# Patient Record
Sex: Male | Born: 1995 | Race: White | Hispanic: No | Marital: Single | Smoking: Former smoker
Health system: Southern US, Community
[De-identification: ages and names within clinical notes are randomized; demographics above are authoritative.]

## PROBLEM LIST (undated history)

## (undated) DIAGNOSIS — J45909 Unspecified asthma, uncomplicated: Secondary | ICD-10-CM

## (undated) HISTORY — PX: OTHER SURGICAL HISTORY: SHX169

## (undated) HISTORY — PX: WISDOM TOOTH EXTRACTION: SHX21

---
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
## 2015-01-04 ENCOUNTER — Encounter: Payer: Self-pay | Admitting: Emergency Medicine

## 2015-01-04 ENCOUNTER — Emergency Department: Payer: 59

## 2015-01-04 ENCOUNTER — Emergency Department
Admission: EM | Admit: 2015-01-04 | Discharge: 2015-01-04 | Disposition: A | Discharge status: Home / Self Care | Payer: 59 | Attending: Emergency Medicine | Admitting: Emergency Medicine

## 2015-01-04 DIAGNOSIS — S62326A Displaced fracture of shaft of fifth metacarpal bone, right hand, initial encounter for closed fracture: Secondary | ICD-10-CM | POA: Insufficient documentation

## 2015-01-04 DIAGNOSIS — S62339A Displaced fracture of neck of unspecified metacarpal bone, initial encounter for closed fracture: Secondary | ICD-10-CM

## 2015-01-04 DIAGNOSIS — Y9289 Other specified places as the place of occurrence of the external cause: Secondary | ICD-10-CM | POA: Insufficient documentation

## 2015-01-04 DIAGNOSIS — Y990 Civilian activity done for income or pay: Secondary | ICD-10-CM | POA: Insufficient documentation

## 2015-01-04 DIAGNOSIS — Y9389 Activity, other specified: Secondary | ICD-10-CM | POA: Insufficient documentation

## 2015-01-04 DIAGNOSIS — W228XXA Striking against or struck by other objects, initial encounter: Secondary | ICD-10-CM | POA: Insufficient documentation

## 2015-01-04 DIAGNOSIS — Z720 Tobacco use: Secondary | ICD-10-CM | POA: Insufficient documentation

## 2015-01-04 HISTORY — DX: Unspecified asthma, uncomplicated: J45.909

## 2015-01-04 MED ORDER — OXYCODONE-ACETAMINOPHEN 5-325 MG PO TABS: 1.0000 | ORAL_TABLET | Freq: Three times a day (TID) | ORAL | Status: DC | PRN

## 2015-01-04 MED ORDER — IBUPROFEN 800 MG PO TABS
ORAL_TABLET | ORAL | Status: AC
Start: 2015-01-04 — End: 2015-01-04
  Administered 2015-01-04: 1 mg
  Filled 2015-01-04: qty 1

## 2015-01-04 MED ORDER — IBUPROFEN 800 MG PO TABS: 800.0000 mg | ORAL_TABLET | Freq: Three times a day (TID) | ORAL | Status: DC | PRN

## 2015-01-04 MED ORDER — OXYCODONE-ACETAMINOPHEN 5-325 MG PO TABS
ORAL_TABLET | ORAL | Status: AC
  Administered 2015-01-04: 1
  Filled 2015-01-04: qty 1

## 2015-01-04 NOTE — ED Notes (Signed)
Patient refused sling stating that he has one at home.

## 2015-01-04 NOTE — ED Provider Notes (Signed)
[facility] Emergency Department Provider Note    ____________________________________________  Time seen: ----------------------------------------- 2150 PM on 01/04/2015 -----------------------------------------  I have reviewed the triage vital signs and the nursing notes.   HISTORY  Chief Complaint Hand Pain   HPI Clinton Wallace is a 19 y.o. male presents to the ER with complaints of right hand pain. States 3 days ago at home chopping ants with side of his right hand on ground. States described as hitting side of hand on ground in a "chopping manner". Denies fall or other injury. States since incident has continued with pain to right lateral hand.  States presents tonight due to swelling and pain persisting. Pain as sharp and aching 7/10. Denies pain radiation. Denies numbness or tingling sensation.  Patient and mother report that approximately 3 years ago with fracture to same area. Reports [place]opedic in [place].     Past Medical History  Diagnosis Date  . 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   Past Surgical History  Procedure Laterality Date  . Wisdom tooth extraction    . 5th digit both hands      No current outpatient prescriptions on file.  Allergies Review of patient's allergies indicates no known allergies.  No family history on file.  Social History History  Substance Use Topics  . Smoking status: Current Every Day Smoker -- 0.50 packs/day  . Smokeless tobacco: Not on file  . Alcohol Use: No    Review of Systems  Constitutional: Negative for fever. Eyes: Negative for visual changes. ENT: Negative for sore throat.  Cardiovascular: Negative for chest pain. Respiratory: Negative for shortness of breath. Gastrointestinal: Negative for abdominal pain, vomiting and diarrhea. Genitourinary: Negative for  dysuria. Musculoskeletal: Negative for back pain. Skin: Negative for rash. Neurological: Negative for headaches, focal weakness or numbness.  10-point ROS otherwise negative.  ____________________________________________   PHYSICAL EXAM:  VITAL SIGNS: ED Triage Vitals  Enc Vitals Group     BP 01/04/15 2005 137/83 mmHg     Pulse Rate 01/04/15 2005 99     Resp -- 18     Temp 01/04/15 2005 97.5 F (36.4 C)     Temp Source 01/04/15 2005 Oral     SpO2 01/04/15 2005 99 %     Weight 01/04/15 2005 160 lb (72.576 kg)     Height 01/04/15 2005 6' (1.829 m)     Head Cir --      Peak Flow --      Pain Score 01/04/15 2011 9     Pain Loc --      Pain Edu? --      Excl. in GC? --      Constitutional: Alert and oriented. Well appearing and in no distress. Eyes: Conjunctivae are normal.  ENT   Head: Normocephalic and atraumatic.   Nose: No congestion/rhinnorhea.   Mouth/Throat: Mucous membranes are moist. Cardiovascular: Normal rate, regular rhythm. Normal and symmetric distal pulses are present in all extremities. No murmurs, rubs, or gallops. Respiratory: Normal respiratory effort without tachypnea nor retractions. Breath sounds are clear and equal bilaterally. No wheezes/rales/rhonchi. Gastrointestinal: Soft and nontender.  Musculoskeletal: Nontender with normal range of motion in all extremities. No joint effusions.  No lower extremity tenderness nor edema. Except:  Right lateral hand along 5th metacarpal with mild to mod swelling and minimal ecchymosis. Skin intact. Moderate TTP. Distal sensation and movement to  right fifth finger intact.   No other tenderness to right hand on palpation.  Neurologic:  Normal speech and language. No gross focal neurologic deficits are appreciated. Speech is normal. No gait instability. Skin:  Skin is warm, dry and intact. No rash noted. Psychiatric: Mood and affect are normal. Speech and behavior are normal. Patient exhibits appropriate  insight and judgment.    RADIOLOGY   RIGHT HAND - COMPLETE 3+ VIEW  COMPARISON: None.  FINDINGS: There is a fracture of the midshaft fifth metacarpal. On lateral projection there is a a sharp cortical break suggesting a acute fracture.  IMPRESSION: Fracture of the midshaft fifth metacarpal.   Electronically Signed By: Genevive BiStewart Edmunds M.D. On: 01/04/2015 20:43 ____________________________________________   PROCEDURES  SPLINT APPLICATION Date/Time: 10:21 PM Authorized by: Renford DillsLindsey Maelyn Berrey Consent: Verbal consent obtained. Risks and benefits: risks, benefits and alternatives were discussed Consent given by: patient Splint applied by: ED technician Location details: right hand Splint type: ocl boxer's splint Supplies used: ocl Post-procedure: The splinted body part was neurovascularly unchanged following the procedure. Patient tolerance: Patient tolerated the procedure well with no immediate complications. sling also applied by tech. _______________________________________   INITIAL IMPRESSION / ASSESSMENT AND PLAN / ED COURSE  Pertinent labs & imaging results that were available during my care of the patient were reviewed by me and considered in my medical decision making (see chart for details).  Presents with 3 days hx of lateral right hand pain post injury at home. Denies fall or other injury. Midshaft fifth metacarpal fracture, closed. Splint. Ice and elevate. PRN pain medication with ibuprofen and percocet. Follow up with orthopedic next week. Per mother, pt has an appt with Ortho in [place] on Monday. Xray copy obtained from xray and sent home with pt for ortho appt. Discussed return parameters.   ____________________________________________   FINAL CLINICAL IMPRESSION(S) / ED DIAGNOSES  Final diagnoses:  Boxer's fracture, closed, initial encounter  Right  Renford DillsLindsey Denitra Donaghey, NP 01/04/15 2222

## 2015-01-04 NOTE — Discharge Instructions (Signed)
Keep hand in splint. Apply ice and elevate. Rest. Take medication as prescribed.   Follow up with your orthopedic as scheduled. Or refer to above to follow up next week.   Return to ER for new or worsening concerns.   Boxer's Fracture You have a break (fracture) of the fifth metacarpal bone. This is commonly called a boxer's fracture. This is the bone in the hand where the little finger attaches. The fracture is in the end of that bone, closest to the little finger. It is usually caused when you hit an object with a clenched fist. Often, the knuckle is pushed down by the impact. Sometimes, the fracture rotates out of position. A boxer's fracture will usually heal within 6 weeks, if it is treated properly and protected from re-injury. Surgery is sometimes needed. A cast, splint, or bulky hand dressing may be used to protect and immobilize a boxer's fracture. Do not remove this device or dressing until your caregiver approves. Keep your hand elevated, and apply ice packs for 15-20 minutes every 2 hours, for the first 2 days. Elevation and ice help reduce swelling and relieve pain. See your caregiver, or an orthopedic specialist, for follow-up care within the next 10 days. This is to make sure your fracture is healing properly. Document Released: 08/18/2005 Document Revised: 11/10/2011 Document Reviewed: 02/05/2007 [facility] Patient Information 2015 [employer] [place]. This information is not intended to replace advice given to you by your health care provider. Make sure you discuss any questions you have with your health care provider.

## 2015-01-04 NOTE — ED Notes (Signed)
Pt presents to ED with right hand pain; hx of boxer fx approx 3 years ago and was told by Ortho that he may need surgery. Intermittent pain since onset of injury. Pt states he was "chopping ants" while at work yesterday and it seemed to trigger pain and swelling. No other known injury. Ibuprofen and ice at home with no relief.

## 2015-01-04 NOTE — ED Notes (Signed)
Pt. States hitting ants with rt. Hand 4 days ago.  Pt. States he applied ice to area, but swelling did not decrease.  Pt. Has swelling to rt. Hand.  Pt. Also states limited movement to with 5th finger.

## 2019-03-22 ENCOUNTER — Emergency Department
Admission: EM | Admit: 2019-03-22 | Discharge: 2019-03-22 | Discharge status: Against Medical Advice | Payer: Self-pay | Attending: Emergency Medicine | Admitting: Emergency Medicine

## 2019-03-22 ENCOUNTER — Encounter: Payer: Self-pay | Admitting: Emergency Medicine

## 2019-03-22 ENCOUNTER — Other Ambulatory Visit: Payer: Self-pay

## 2019-03-22 DIAGNOSIS — Z5321 Procedure and treatment not carried out due to patient leaving prior to being seen by health care provider: Secondary | ICD-10-CM | POA: Insufficient documentation

## 2019-03-22 NOTE — ED Notes (Signed)
No answer when called several times from lobby 

## 2019-03-22 NOTE — ED Triage Notes (Signed)
Pt states that he injured his back years ago and tonight was playing softball and felt something pop and now the pain to his lower back is worse. Pt mom states that he has been to ortho MD's and chiropractors for the continued pain but nothing works.

## 2020-11-29 ENCOUNTER — Ambulatory Visit: Payer: No Typology Code available for payment source | Admitting: Family Medicine

## 2020-11-29 ENCOUNTER — Encounter: Payer: Self-pay | Admitting: Family Medicine

## 2020-11-29 ENCOUNTER — Other Ambulatory Visit: Payer: Self-pay

## 2020-11-29 ENCOUNTER — Other Ambulatory Visit: Payer: Self-pay | Admitting: Family Medicine

## 2020-11-29 VITALS — BP 125/64 | HR 62 | Temp 96.9°F | Ht 72.0 in | Wt 182.0 lb

## 2020-11-29 DIAGNOSIS — J3089 Other allergic rhinitis: Secondary | ICD-10-CM

## 2020-11-29 DIAGNOSIS — Z1159 Encounter for screening for other viral diseases: Secondary | ICD-10-CM

## 2020-11-29 DIAGNOSIS — J4520 Mild intermittent asthma, uncomplicated: Secondary | ICD-10-CM | POA: Diagnosis not present

## 2020-11-29 DIAGNOSIS — Z114 Encounter for screening for human immunodeficiency virus [HIV]: Secondary | ICD-10-CM

## 2020-11-29 DIAGNOSIS — J0110 Acute frontal sinusitis, unspecified: Secondary | ICD-10-CM

## 2020-11-29 DIAGNOSIS — Z7689 Persons encountering health services in other specified circumstances: Secondary | ICD-10-CM

## 2020-11-29 DIAGNOSIS — Z0000 Encounter for general adult medical examination without abnormal findings: Secondary | ICD-10-CM

## 2020-11-29 MED ORDER — AMOXICILLIN-POT CLAVULANATE 875-125 MG PO TABS: 1.0000 | ORAL_TABLET | Freq: Two times a day (BID) | ORAL | 0 refills | Status: AC

## 2020-11-29 MED ORDER — FLUTICASONE PROPIONATE 50 MCG/ACT NA SUSP: 2.0000 | Freq: Every day | NASAL | 3 refills | Status: AC

## 2020-11-29 NOTE — Patient Instructions (Addendum)
Thank you for coming to the office today.  If you need a new inhaler (Albuterol rescue inhaler) contact the office or your pharmacy and ask for a refill. We can order it.  Also consider Singulair for allergy/asthma in the future if you need additional help.  Start nasal steroid Flonase 2 sprays in each nostril daily for 4-6 weeks, may repeat course seasonally or as needed   DUE for FASTING BLOOD WORK (no food or drink after midnight before the lab appointment, only water or coffee without cream/sugar on the morning of)  SCHEDULE "Lab Only" visit in the morning at the clinic for lab draw in 3-4 WEEKS   - Make sure Lab Only appointment is at about 1 week before your next appointment, so that results will be available  For Lab Results, once available within 2-3 days of blood draw, you can can log in to MyChart online to view your results and a brief explanation. Also, we can discuss results at next follow-up visit.    Please schedule a Follow-up Appointment to: Return in about 4 weeks (around 12/27/2020) for 3-4 weeks fasting lab only then 1 week later Annual Physical.  If you have any other questions or concerns, please feel free to call the office or send a message through MyChart. You may also schedule an earlier appointment if necessary.  Additionally, you may be receiving a survey about your experience at our office within a few days to 1 week by e-mail or mail. We value your feedback.  Saralyn Pilar, DO [facility], [place]

## 2020-11-29 NOTE — Progress Notes (Signed)
Subjective:    Patient ID: Clinton Wallace, male    DOB: 11/23/1995, 25 y.o.   MRN: [id number]  Clinton Wallace is a 25 y.o. male presenting on 11/29/2020 for Establish Care  Previous PCP Dr Vear Clock in [place]. That provider has passed away. He is here for new patient.  HPI  He works as a Hotel manager in spare time He lives in [place], family recommended that he come here who live nearby  Mild Intermittent Asthma, history of childhood asthma He has an old albuterol inhaler, that he uses PRN He takes Cetirizine Zyrtec 10mg  daily, seasonally Usually asthma bothers him more during spring season  Sinusitis Right Headache / R Ear pressure Present for several months Admits some R sided sinus pressure and congestion. Difficulty breathing out of R side of nose He feels muscles in neck are tense and spasm Not taking nasal spray Admits episodic headache PRN usually improves with Tylenol / Ibuprofen  Health Maintenance: Check Person IR vaccines He is due HPV vaccine, declines other vaccine, he will got to local HD  No flowsheet data found.  Past Medical History:  Diagnosis Date  . Asthma    Past Surgical History:  Procedure Laterality Date  . 5th digit both hands    . WISDOM TOOTH EXTRACTION     Social History   Socioeconomic History  . Marital status: Single    Spouse name: Not on file  . Number of children: Not on file  . Years of education: 03-23-1991  . Highest education level: High school graduate  Occupational History  . Occupation: plumber  Tobacco Use  . Smoking status: Former Smoker    Packs/day: 0.50    Years: 7.00    Pack years: 3.50    Types: Cigarettes    Quit date: 11/29/2016    Years since quitting: 4.0  . Smokeless tobacco: Never Used  Vaping Use  . Vaping Use: Every day  . Start date: 10/30/2017  . Substances: Nicotine  Substance and Sexual Activity  . Alcohol use: No  . Drug use: No  . Sexual activity: Not on file  Other Topics  Concern  . Not on file  Social History Narrative  . Not on file   Social Determinants of Health   Financial Resource Strain: Not on file  Food Insecurity: Not on file  Transportation Needs: Not on file  Physical Activity: Not on file  Stress: Not on file  Social Connections: Not on file  Intimate Partner Violence: Not on file   Family History  Problem Relation Age of Onset  . Depression Mother   . Heart disease Father   . Stroke Father   . Cancer Maternal Grandfather    Current Outpatient Medications on File Prior to Visit  Medication Sig  . albuterol (VENTOLIN HFA) 108 (90 Base) MCG/ACT inhaler Inhale into the lungs every 6 (six) hours as needed.  . cetirizine (ZYRTEC) 10 MG tablet Take 10 mg by mouth daily.   No current facility-administered medications on file prior to visit.    Review of Systems Per HPI unless specifically indicated above      Objective:    BP 125/64 (BP Location: Right Arm, Patient Position: Sitting, Cuff Size: Normal)   Pulse 62   Temp (!) 96.9 F (36.1 C) (Temporal)   Ht 6' (1.829 m)   Wt 182 lb (82.6 kg)   SpO2 99%   BMI 24.68 kg/m   Wt Readings from Last 3  Encounters:  11/29/20 182 lb (82.6 kg)  03/22/19 160 lb (72.6 kg)  01/04/15 160 lb (72.6 kg) (59 %, Z= 0.22)*   * Growth percentiles are based on CDC (Boys, 2-20 Years) data.    Physical Exam Vitals and nursing note reviewed.  Constitutional:      General: He is not in acute distress.    Appearance: He is well-developed. He is not diaphoretic.     Comments: Well-appearing, comfortable, cooperative  HENT:     Head: Normocephalic and atraumatic.     Comments: R maxillary sinus tender    Right Ear: Ear canal and external ear normal. There is no impacted cerumen.     Left Ear: Ear canal and external ear normal. There is no impacted cerumen.     Ears:     Comments: Bilateral tm with mild clear effusion without erythema or bulging or purulence     Nose:     Comments: R nasal  turbinate inflamed some edema    Mouth/Throat:     Mouth: Mucous membranes are moist.     Pharynx: No oropharyngeal exudate or posterior oropharyngeal erythema.  Eyes:     General:        Right eye: No discharge.        Left eye: No discharge.     Conjunctiva/sclera: Conjunctivae normal.  Neck:     Thyroid: No thyromegaly.  Cardiovascular:     Rate and Rhythm: Normal rate and regular rhythm.     Heart sounds: Normal heart sounds. No murmur heard.   Pulmonary:     Effort: Pulmonary effort is normal. No respiratory distress.     Breath sounds: Normal breath sounds. No wheezing or rales.  Musculoskeletal:        General: Normal range of motion.     Cervical back: Normal range of motion and neck supple.  Lymphadenopathy:     Cervical: No cervical adenopathy.  Skin:    General: Skin is warm and dry.     Findings: No erythema or rash.  Neurological:     Mental Status: He is alert and oriented to person, place, and time.  Psychiatric:        Behavior: Behavior normal.     Comments: Well groomed, good eye contact, normal speech and thoughts    Results for orders placed or performed during the hospital encounter of 05/13/09  POCT rapid strep A  Result Value Ref Range   Streptococcus, Group A Screen (Direct) NEGATIVE NEGATIVE      Assessment & Plan:   Problem List Items Addressed This Visit    Mild intermittent asthma without complication   Relevant Medications   albuterol (VENTOLIN HFA) 108 (90 Base) MCG/ACT inhaler   Environmental and seasonal allergies   Relevant Medications   fluticasone (FLONASE) 50 MCG/ACT nasal spray    Other Visit Diagnoses    Acute non-recurrent frontal sinusitis    -  Primary   Relevant Medications   cetirizine (ZYRTEC) 10 MG tablet   fluticasone (FLONASE) 50 MCG/ACT nasal spray   amoxicillin-clavulanate (AUGMENTIN) 875-125 MG tablet   Encounter to establish care with new doctor          #Asthma Mild intermittent / seasonal allergies No  acute flare Has old albuterol Offer new one when ready Future singulair Continue zyrtec  Consistent with acute R maxillary sinusitis, likely initially viral URI vs allergic rhinitis component with worsening concern for bacterial infection.  Duration >2+ weeks  Plan: 1  Start  Augmentin 875-125mg  PO BID x 10 days 2. Continue zyrtec 3. Start nasal steroid Flonase 2 sprays in each nostril daily for 4-6 weeks, may repeat course seasonally or as needed 4. Return criteria reviewed   Meds ordered this encounter  Medications  . fluticasone (FLONASE) 50 MCG/ACT nasal spray    Sig: Place 2 sprays into both nostrils daily. Use for 4-6 weeks then stop and use seasonally or as needed.    Dispense:  16 g    Refill:  3  . amoxicillin-clavulanate (AUGMENTIN) 875-125 MG tablet    Sig: Take 1 tablet by mouth 2 (two) times daily.    Dispense:  20 tablet    Refill:  0      Follow up plan: Return in about 4 weeks (around 12/27/2020) for 3-4 weeks fasting lab only then 1 week later Annual Physical.   Future labs  CMET, CBC, Lipid, Hep C, HIV routine.  Saralyn Pilar, DO [facility] 11/29/2020, 10:36 AM

## 2020-11-30 ENCOUNTER — Ambulatory Visit: Payer: Self-pay | Admitting: Physician Assistant

## 2020-12-19 ENCOUNTER — Other Ambulatory Visit: Payer: No Typology Code available for payment source

## 2020-12-19 ENCOUNTER — Other Ambulatory Visit: Payer: Self-pay

## 2020-12-19 DIAGNOSIS — Z0000 Encounter for general adult medical examination without abnormal findings: Secondary | ICD-10-CM

## 2020-12-19 DIAGNOSIS — Z114 Encounter for screening for human immunodeficiency virus [HIV]: Secondary | ICD-10-CM

## 2020-12-19 DIAGNOSIS — Z1159 Encounter for screening for other viral diseases: Secondary | ICD-10-CM

## 2020-12-19 LAB — CBC WITH DIFFERENTIAL/PLATELET
HCT: 45.2 % (ref 38.5–50.0)
MCH: 30.5 pg (ref 27.0–33.0)
MCHC: 33.2 g/dL (ref 32.0–36.0)
Total Lymphocyte: 44.3 %

## 2020-12-20 LAB — CBC WITH DIFFERENTIAL/PLATELET: MPV: 11.6 fL (ref 7.5–12.5)

## 2020-12-20 LAB — HEPATITIS C ANTIBODY: Hepatitis C Ab: NONREACTIVE

## 2020-12-20 LAB — LIPID PANEL: HDL: 61 mg/dL (ref >=40)

## 2020-12-20 LAB — COMPLETE METABOLIC PANEL WITH GFR
Glucose, Bld: 98 mg/dL (ref 65–99)
Total Bilirubin: 0.5 mg/dL (ref 0.2–1.2)
Total Protein: 6.8 g/dL (ref 6.1–8.1)

## 2020-12-21 ENCOUNTER — Other Ambulatory Visit: Payer: Self-pay | Admitting: Family Medicine

## 2020-12-21 DIAGNOSIS — J4520 Mild intermittent asthma, uncomplicated: Secondary | ICD-10-CM

## 2020-12-21 LAB — COMPLETE METABOLIC PANEL WITH GFR
AST: 15 U/L (ref 10–40)
Alkaline phosphatase (APISO): 72 U/L (ref 36–130)
GFR, Est African American: 140 mL/min/{1.73_m2} (ref >=60)
Potassium: 4.5 mmol/L (ref 3.5–5.3)

## 2020-12-21 LAB — HIV-1/2 AB - DIFFERENTIATION
HIV-1 antibody: NEGATIVE
HIV-2 Ab: NEGATIVE

## 2020-12-21 LAB — LIPID PANEL: LDL Cholesterol (Calc): 57 mg/dL (calc)

## 2020-12-21 LAB — CBC WITH DIFFERENTIAL/PLATELET
Lymphs Abs: 2747 cells/uL (ref 850–3900)
Neutro Abs: 2269 cells/uL (ref 1500–7800)

## 2020-12-21 LAB — HEPATITIS C ANTIBODY: SIGNAL TO CUT-OFF: 0.01 (ref <1.00)

## 2020-12-21 MED ORDER — ALBUTEROL SULFATE HFA 108 (90 BASE) MCG/ACT IN AERS: 1.0000 | INHALATION_SPRAY | Freq: Four times a day (QID) | RESPIRATORY_TRACT | 2 refills | Status: AC | PRN

## 2020-12-21 NOTE — Telephone Encounter (Signed)
Medication Refill - Medication: albuterol (VENTOLIN HFA) 108 (90 Base) MCG/ACT inhaler   Preferred Pharmacy (with phone number or street name):  [facility] [place]  - [address] Phone:  [phone]  Fax:  [phone]       Agent: Please be advised that RX refills may take up to 3 business days. We ask that you follow-up with your pharmacy.     

## 2020-12-21 NOTE — Telephone Encounter (Signed)
Requested medication (s) are due for refill today: Yes  Requested medication (s) are on the active medication list: Yes  Last refill:  11/29/20  Future visit scheduled: Yes  Notes to clinic:  Historical provider.    Requested Prescriptions  Pending Prescriptions Disp Refills   albuterol (VENTOLIN HFA) 108 (90 Base) MCG/ACT inhaler      Sig: Inhale into the lungs every 6 (six) hours as needed.      Pulmonology:  [facility]onists Failed - 12/21/2020 10:45 AM      Failed - One inhaler should last at least one month. If the patient is requesting refills earlier, contact the patient to check for uncontrolled symptoms.      Passed - Valid encounter within last 12 months    Recent Outpatient Visits           3 weeks ago Acute non-recurrent frontal sinusitis   [facility], Netta Neat, DO       Future Appointments             In 5 days Althea Charon, Netta Neat, DO [facility]

## 2020-12-24 LAB — CBC WITH DIFFERENTIAL/PLATELET
Absolute Monocytes: 484 cells/uL (ref 200–950)
Basophils Absolute: 62 cells/uL (ref 0–200)
Basophils Relative: 1 %
Eosinophils Absolute: 639 cells/uL — ABNORMAL HIGH (ref 15–500)
Eosinophils Relative: 10.3 %
Hemoglobin: 15 g/dL (ref 13.2–17.1)
MCV: 92.1 fL (ref 80.0–100.0)
Monocytes Relative: 7.8 %
Neutrophils Relative %: 36.6 %
Platelets: 261 10*3/uL (ref 140–400)
RBC: 4.91 10*6/uL (ref 4.20–5.80)
RDW: 12.6 % (ref 11.0–15.0)
WBC: 6.2 10*3/uL (ref 3.8–10.8)

## 2020-12-24 LAB — COMPLETE METABOLIC PANEL WITH GFR
AG Ratio: 2 (calc) (ref 1.0–2.5)
ALT: 18 U/L (ref 9–46)
Albumin: 4.5 g/dL (ref 3.6–5.1)
BUN: 15 mg/dL (ref 7–25)
CO2: 25 mmol/L (ref 20–32)
Calcium: 9.5 mg/dL (ref 8.6–10.3)
Chloride: 107 mmol/L (ref 98–110)
Creat: 0.85 mg/dL (ref 0.60–1.35)
GFR, Est Non African American: 121 mL/min/{1.73_m2} (ref >=60)
Globulin: 2.3 g/dL (calc) (ref 1.9–3.7)
Sodium: 140 mmol/L (ref 135–146)

## 2020-12-24 LAB — LIPID PANEL
Cholesterol: 141 mg/dL (ref <200)
Non-HDL Cholesterol (Calc): 80 mg/dL (calc) (ref <130)
Total CHOL/HDL Ratio: 2.3 (calc) (ref <5.0)
Triglycerides: 151 mg/dL — ABNORMAL HIGH (ref <150)

## 2020-12-24 LAB — HIV 1 RNA, QL RT PCR: HIV-1 RNA, Qualitative, TMA: NOT DETECTED

## 2020-12-24 LAB — HIV ANTIBODY (ROUTINE TESTING W REFLEX): HIV 1&2 Ab, 4th Generation: REACTIVE — AB

## 2020-12-26 ENCOUNTER — Encounter: Payer: No Typology Code available for payment source | Admitting: Family Medicine

## 2020-12-31 ENCOUNTER — Encounter: Payer: No Typology Code available for payment source | Admitting: Family Medicine
